# Patient Record
Sex: Female | Born: 1955 | Race: Black or African American | Hispanic: No | Marital: Single | Smoking: Never smoker
Health system: Southern US, Community
[De-identification: ages and names within clinical notes are randomized; demographics above are authoritative.]

## PROBLEM LIST (undated history)

## (undated) DIAGNOSIS — M1990 Unspecified osteoarthritis, unspecified site: Secondary | ICD-10-CM

## (undated) DIAGNOSIS — I10 Essential (primary) hypertension: Secondary | ICD-10-CM

## (undated) HISTORY — PX: ABDOMINAL HYSTERECTOMY: SHX81

## (undated) HISTORY — PX: HYSTEROTOMY: SHX1776

---

## 1998-07-07 ENCOUNTER — Ambulatory Visit (HOSPITAL_COMMUNITY): Admission: RE | Admit: 1998-07-07 | Discharge: 1998-07-07 | Payer: Self-pay | Admitting: Family Medicine

## 1998-07-07 ENCOUNTER — Encounter: Payer: Self-pay | Admitting: Family Medicine

## 1998-07-26 ENCOUNTER — Ambulatory Visit (HOSPITAL_COMMUNITY): Admission: RE | Admit: 1998-07-26 | Discharge: 1998-07-26 | Payer: Self-pay | Admitting: Family Medicine

## 1998-08-09 ENCOUNTER — Encounter: Payer: Self-pay | Admitting: Family Medicine

## 1998-08-09 ENCOUNTER — Ambulatory Visit (HOSPITAL_COMMUNITY): Admission: RE | Admit: 1998-08-09 | Discharge: 1998-08-09 | Payer: Self-pay | Admitting: Family Medicine

## 1999-09-17 ENCOUNTER — Ambulatory Visit (HOSPITAL_COMMUNITY): Admission: RE | Admit: 1999-09-17 | Discharge: 1999-09-17 | Payer: Self-pay | Admitting: *Deleted

## 1999-10-20 ENCOUNTER — Ambulatory Visit (HOSPITAL_COMMUNITY): Admission: RE | Admit: 1999-10-20 | Discharge: 1999-10-20 | Payer: Self-pay | Admitting: *Deleted

## 2000-04-12 ENCOUNTER — Ambulatory Visit (HOSPITAL_COMMUNITY): Admission: RE | Admit: 2000-04-12 | Discharge: 2000-04-12 | Payer: Self-pay | Admitting: *Deleted

## 2001-06-01 ENCOUNTER — Ambulatory Visit (HOSPITAL_COMMUNITY): Admission: RE | Admit: 2001-06-01 | Discharge: 2001-06-01 | Payer: Self-pay | Admitting: Cardiology

## 2001-06-01 ENCOUNTER — Encounter: Payer: Self-pay | Admitting: Cardiology

## 2003-06-03 ENCOUNTER — Encounter: Payer: Self-pay | Admitting: Internal Medicine

## 2003-06-03 ENCOUNTER — Ambulatory Visit (HOSPITAL_COMMUNITY): Admission: RE | Admit: 2003-06-03 | Discharge: 2003-06-03 | Payer: Self-pay | Admitting: Internal Medicine

## 2004-03-18 ENCOUNTER — Emergency Department (HOSPITAL_COMMUNITY): Admission: EM | Admit: 2004-03-18 | Discharge: 2004-03-18 | Payer: Self-pay | Admitting: Emergency Medicine

## 2004-06-20 ENCOUNTER — Encounter: Admission: RE | Admit: 2004-06-20 | Discharge: 2004-06-20 | Payer: Self-pay | Admitting: Internal Medicine

## 2004-07-13 ENCOUNTER — Ambulatory Visit (HOSPITAL_COMMUNITY): Admission: RE | Admit: 2004-07-13 | Discharge: 2004-07-13 | Payer: Self-pay | Admitting: Internal Medicine

## 2004-08-23 ENCOUNTER — Encounter: Admission: RE | Admit: 2004-08-23 | Discharge: 2004-11-18 | Payer: Self-pay | Admitting: Neurosurgery

## 2005-11-22 ENCOUNTER — Encounter: Admission: RE | Admit: 2005-11-22 | Discharge: 2005-11-22 | Payer: Self-pay | Admitting: Gastroenterology

## 2006-04-23 ENCOUNTER — Emergency Department (HOSPITAL_COMMUNITY): Admission: EM | Admit: 2006-04-23 | Discharge: 2006-04-24 | Payer: Self-pay | Admitting: Emergency Medicine

## 2006-05-14 ENCOUNTER — Emergency Department (HOSPITAL_COMMUNITY): Admission: EM | Admit: 2006-05-14 | Discharge: 2006-05-14 | Payer: Self-pay | Admitting: Family Medicine

## 2006-05-27 ENCOUNTER — Encounter: Admission: RE | Admit: 2006-05-27 | Discharge: 2006-05-27 | Payer: Self-pay | Admitting: Neurosurgery

## 2008-07-10 ENCOUNTER — Encounter: Admission: RE | Admit: 2008-07-10 | Discharge: 2008-07-10 | Payer: Self-pay | Admitting: Neurosurgery

## 2008-08-04 ENCOUNTER — Encounter: Admission: RE | Admit: 2008-08-04 | Discharge: 2008-08-04 | Payer: Self-pay | Admitting: Neurosurgery

## 2010-02-10 ENCOUNTER — Encounter: Admission: RE | Admit: 2010-02-10 | Discharge: 2010-02-10 | Payer: Self-pay | Admitting: Internal Medicine

## 2010-10-24 ENCOUNTER — Encounter: Payer: Self-pay | Admitting: Internal Medicine

## 2011-02-15 ENCOUNTER — Ambulatory Visit (HOSPITAL_COMMUNITY)
Admission: RE | Admit: 2011-02-15 | Discharge: 2011-02-15 | Disposition: A | Discharge status: Home / Self Care | Payer: BC Managed Care – PPO | Source: Ambulatory Visit | Attending: Internal Medicine | Admitting: Internal Medicine

## 2011-02-15 ENCOUNTER — Other Ambulatory Visit (HOSPITAL_COMMUNITY): Payer: Self-pay | Admitting: Internal Medicine

## 2011-02-15 DIAGNOSIS — Z1231 Encounter for screening mammogram for malignant neoplasm of breast: Secondary | ICD-10-CM

## 2011-02-18 NOTE — Cardiovascular Report (Signed)
. [facility]  Patient:    Patricia Anthony, Patricia Anthony Visit Number: [id number] MRN: [id number]          Service Type: CAT Location: [facility] 2854 [id number] Attending Physician:  Silvestre Mesi Proc. Date: 06/01/01 Admit Date:  06/01/2001   CC:         Trudee Kuster, M.D.  Cardiac Catheterization Laboratory   Cardiac Catheterization  PROCEDURES PERFORMED: 1. Left heart catheterization. 2. Coronary cineangiography. 3. Left ventricular cineangiogram. 4. Perclose of the right femoral artery.  INDICATION FOR PROCEDURES:  This 55 year old female had the recent onset of chest pain and the Cardiolite stress test which was done showed evidence for lateral ischemia.  She was then scheduled for a diagnostic cardiac catheterization.  DESCRIPTION OF PROCEDURE:  After signing an informed consent, the patient was premedicated with 50 mg of Benadryl intravenously and brought to the cardiac catheterization laboratory.  Her right groin was prepped and draped in a sterile fashion and anesthetized locally with 1% Lidocaine.  A 6-French introducer sheath was inserted percutaneously into the right femoral artery.  Six [place] #4 Judkins coronary catheters were used to make injections into the coronary arteries.  A 6-French pigtail catheter was used to measure pressures in the left ventricle and aorta and to make midstream injection into the left ventricle.  The patient tolerated the procedure well and no complications were noted.  At the end of the procedure, the catheter and sheath were removed from the right femoral artery and hemostasis was easily obtained with a Perclose closure system.  MEDICATIONS GIVEN:  Versed 1 mg IV x 3.  HEMODYNAMIC DATA:  Left ventricular pressure 142/0-13.  Aortic pressure 142/83, with a mean of 109.  Left ventricular ejection fraction was estimated at 60%.  CINE FINDINGS:  CORONARY CINEANGIOGRAPHY: Left coronary artery:  The ostium and left  main appear normal.  Left anterior descending:  Appears normal.  Circumflex coronary artery:  Appears normal.  Right coronary artery:  Appears normal.  LEFT VENTRICULAR CINEANGIOGRAM:  The left ventricular chamber size and contractility appear normal.  The mitral valve shows mild prolapse of the posterior leaflet.  There was no evidence for significant mitral insufficiency and there was no mitral valvular or annular calcification.  The aortic valve appeared normal with three cusps and no evidence for thickening or calcification.  INTERPRETATION: 1. Normal coronary arteries. 2. Normal left ventricular function. 3. Mild mitral valve prolapse without evidence for insufficiency. 4. Normal aortic valve. 5. Successful Perclose of the right femoral artery.  DISPOSITION:  We will monitor in the short-stay unit prior to discharge later today.  We will notify Dr. Jearl Klinefelter of the results and have followup with him in the office. Attending Physician:  Silvestre Mesi DD:  06/01/01 TD:  06/01/01 Job: [phone] [id number]

## 2012-01-01 ENCOUNTER — Encounter (HOSPITAL_COMMUNITY): Payer: Self-pay | Admitting: *Deleted

## 2012-01-01 ENCOUNTER — Emergency Department (HOSPITAL_COMMUNITY)
Admission: EM | Admit: 2012-01-01 | Discharge: 2012-01-01 | Disposition: A | Discharge status: Home / Self Care | Payer: BC Managed Care – PPO | Source: Home / Self Care

## 2012-01-01 DIAGNOSIS — M069 Rheumatoid arthritis, unspecified: Secondary | ICD-10-CM

## 2012-01-01 DIAGNOSIS — R21 Rash and other nonspecific skin eruption: Secondary | ICD-10-CM

## 2012-01-01 HISTORY — DX: Essential (primary) hypertension: I10

## 2012-01-01 HISTORY — DX: Unspecified osteoarthritis, unspecified site: M19.90

## 2012-01-01 MED ORDER — HYDROCODONE-ACETAMINOPHEN 5-500 MG PO TABS: 1.0000 | ORAL_TABLET | Freq: Four times a day (QID) | ORAL | Status: AC | PRN

## 2012-01-01 MED ORDER — PREDNISONE 20 MG PO TABS: 20.0000 mg | ORAL_TABLET | Freq: Every day | ORAL | Status: AC

## 2012-01-01 NOTE — Discharge Instructions (Signed)
Thank you for coming in today. Stop your old prednisone.  Start the new prednisone 1 pill daily until you see your doctor.  Use the hydrocortisone as needed.  I think you are having a flair of your rheumatoid arthritis. Call or go to the emergency room if you get worse, have trouble breathing, have chest pains, or palpitations.     Rheumatoid Arthritis Rheumatoid arthritis is a long-term (chronic) inflammatory disease that causes pain, swelling, and stiffness of the joints. It can affect the entire body. The effects of rheumatoid arthritis vary widely among those with the condition. CAUSES  The cause of rheumatoid arthritis is not known. It tends to run in families and is more common in women. Certain cells of the body's natural defense system (immune system) do not work properly and begin to attack healthy joints. It primarily involves the connective tissue that lines the joints (synovial membrane). This can cause damage to the joint. SYMPTOMS   Pain, stiffness, swelling, and decreased motion of many joints, especially in the hands and feet.   Stiffness that is worse in the morning. It may last 1 to 2 hours or longer.   Fatigue.   Loss of appetite.   Low-grade fever.   Dry eyes and mouth.   Firm lumps (rheumatoid nodules) that grow beneath the skin in areas such as the elbows and hands.  DIAGNOSIS  Diagnosis is based on the symptoms described, exam, and blood tests. Sometimes, X-rays are helpful. TREATMENT  The goals of treatment are to relieve pain, reduce inflammation, and slow down or stop joint damage and disability. Methods vary and may include:  Maintaining a balance of rest, exercise, and proper nutrition.   Medicines:   Pain relievers (analgesics).   Corticosteroids and nonsteroidal anti-inflammatory drugs (NSAIDs) to reduce inflammation.   Disease-modifying antirheumatic drugs (DMARDs) to try to slow the course of the disease.   Biologic response modifiers to  reduce inflammation and damage.   Surgery for patients with severe joint damage. Joint replacement or fusing of joints may be needed.   Routine monitoring and ongoing care, such as office visits, blood and urine tests, and x-rays.  HOME CARE INSTRUCTIONS   Remain physically active and reduce activity when the disease gets worse.   Eat a well-balanced diet.   Use heat on affected joints when you wake up and before activities.   Use ice on affected joints following activities or exercising.   Take all medicines and supplements as directed by your caregiver.   Use splints as your caregiver prescribes. Splints help maintain joint position and function.   Do not sleep with pillows under your knees. This may lead to spasms.   Participate in a self-management program to keep current with the latest treatment and coping skills.  SEEK IMMEDIATE MEDICAL CARE IF:  You have fainting episodes.   You have periods of extreme weakness.   A hot, painful joint develops rapidly and is more severe than usual joint aches.   You develop chills.   You have a fever.  FOR MORE INFORMATION  American College of Rheumatology: www.rheumatology.org Arthritis Foundation: www.arthritis.org Document Released: 09/16/2000 Document Revised: 09/08/2011 Document Reviewed: 12/31/2009 [facility] Patient Information 2012 [place], [place].

## 2012-01-01 NOTE — ED Notes (Signed)
Pt with onset of pain left leg toes to above knee Thursday rash started on legs now spreading all over - eye lids swollen - rash painful to touch

## 2012-01-01 NOTE — ED Provider Notes (Signed)
Medical screening examination/treatment/procedure(s) were performed by non-physician practitioner and as supervising physician I was immediately available for consultation/collaboration.  Marianna Cid   Zoya Sprecher, MD 01/01/12 1958 

## 2012-01-01 NOTE — ED Provider Notes (Signed)
Patricia Anthony is a 56 y.o. female who presents to Urgent Care today for left leg pain associated with full body out break of painful erythematous papules starting on Thursday.  Patient has rheumatoid arthritis which is treated with prednisone and methotrexate. One or 2 months ago her methotrexate was reduced from 9 tablets a week to 1 tablet a week.  Otherwise she feels well without any fevers chills trouble breathing or itching.   She has pain in her left leg including her knee but has not had pain elsewhere in her body.     PMH reviewed. Significant for rheumatoid arthritis ROS as above otherwise neg.  no chest pains, palpitations, fevers, chills, abdominal pain nausea or vomiting. Medications reviewed. No current facility-administered medications for this encounter.   Current Outpatient Prescriptions  Medication Sig Dispense Refill  . FOLIC ACID PO Take by mouth.      Marland Kitchen HYDROCHLOROTHIAZIDE PO Take by mouth.      . Methotrexate Sodium (METHOTREXATE PO) Take by mouth. Once a week      . NABUMETONE PO Take by mouth.      . potassium chloride (K-DUR) 10 MEQ tablet Take by mouth 2 (two) times daily. Unknown dosage      . HYDROcodone-acetaminophen (VICODIN) 5-500 MG per tablet Take 1 tablet by mouth every 6 (six) hours as needed for pain.  30 tablet  0  . predniSONE (DELTASONE) 20 MG tablet Take 1 tablet (20 mg total) by mouth daily.  20 tablet  0    Exam:  BP 155/86  Pulse 86  Temp(Src) 98.9 F (37.2 C) (Oral)  Resp 18  SpO2 96% Gen: Well NAD HEENT: EOMI,  MMM Lungs: CTABL Nl WOB Heart: RRR no MRG Abd: NABS, NT, ND Skin: Small palpable erythematous papules that blanch and are mildly tender on both lower extremities and scattered across the trunk and upper extremities.  MSK: Mild left knee effusion otherwise nonremarkable  No results found for this or any previous visit (from the past 24 hour(s)). No results found.  Assessment and Plan: 56 y.o. female with possible rheumatology  flair.  Shingles is a possibility however it is across most of her body and her pain is only on the left side.   I don't think these are purpura  as they blanch.  As her anemia modulator dose has been drastically reduced recently I think that rheumatologic flare is the most likely etiology.  Plan to treat with prednisone 20 mg daily until she follows up with her primary care doctor.  Discussed warning signs or symptoms.  Patient expresses understanding. Please see patient handout.     Rodolph Bong, MD 01/01/12 [phone]

## 2012-03-21 ENCOUNTER — Other Ambulatory Visit (HOSPITAL_COMMUNITY): Payer: Self-pay | Admitting: Internal Medicine

## 2012-03-21 DIAGNOSIS — Z1231 Encounter for screening mammogram for malignant neoplasm of breast: Secondary | ICD-10-CM

## 2012-04-12 ENCOUNTER — Ambulatory Visit (HOSPITAL_COMMUNITY)
Admission: RE | Admit: 2012-04-12 | Discharge: 2012-04-12 | Disposition: A | Discharge status: Home / Self Care | Payer: BC Managed Care – PPO | Source: Ambulatory Visit | Attending: Internal Medicine | Admitting: Internal Medicine

## 2012-04-12 DIAGNOSIS — Z1231 Encounter for screening mammogram for malignant neoplasm of breast: Secondary | ICD-10-CM

## 2012-07-29 ENCOUNTER — Emergency Department (HOSPITAL_COMMUNITY)
Admission: EM | Admit: 2012-07-29 | Discharge: 2012-07-30 | Disposition: A | Discharge status: Home / Self Care | Payer: BC Managed Care – PPO | Attending: Emergency Medicine | Admitting: Emergency Medicine

## 2012-07-29 ENCOUNTER — Encounter (HOSPITAL_COMMUNITY): Payer: Self-pay | Admitting: Emergency Medicine

## 2012-07-29 ENCOUNTER — Emergency Department (HOSPITAL_COMMUNITY): Payer: BC Managed Care – PPO

## 2012-07-29 DIAGNOSIS — M129 Arthropathy, unspecified: Secondary | ICD-10-CM | POA: Insufficient documentation

## 2012-07-29 DIAGNOSIS — Z79899 Other long term (current) drug therapy: Secondary | ICD-10-CM | POA: Insufficient documentation

## 2012-07-29 DIAGNOSIS — T148XXA Other injury of unspecified body region, initial encounter: Secondary | ICD-10-CM | POA: Insufficient documentation

## 2012-07-29 DIAGNOSIS — I10 Essential (primary) hypertension: Secondary | ICD-10-CM | POA: Insufficient documentation

## 2012-07-29 DIAGNOSIS — Y939 Activity, unspecified: Secondary | ICD-10-CM | POA: Insufficient documentation

## 2012-07-29 MED ORDER — IBUPROFEN 600 MG PO TABS: 600.0000 mg | ORAL_TABLET | Freq: Four times a day (QID) | ORAL | Status: DC | PRN

## 2012-07-29 MED ORDER — SODIUM CHLORIDE 0.9 % IV BOLUS (SEPSIS)
1000.0000 mL | Freq: Once | INTRAVENOUS | Status: AC
  Administered 2012-07-29: 1000 mL via INTRAVENOUS

## 2012-07-29 NOTE — ED Notes (Signed)
Patient in route to X-ray.  
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
## 2013-02-24 ENCOUNTER — Emergency Department (HOSPITAL_COMMUNITY): Payer: BC Managed Care – PPO

## 2013-02-24 ENCOUNTER — Encounter (HOSPITAL_COMMUNITY): Payer: Self-pay | Admitting: *Deleted

## 2013-02-24 ENCOUNTER — Emergency Department (HOSPITAL_COMMUNITY)
Admission: EM | Admit: 2013-02-24 | Discharge: 2013-02-24 | Disposition: A | Discharge status: Home / Self Care | Payer: BC Managed Care – PPO | Attending: Emergency Medicine | Admitting: Emergency Medicine

## 2013-02-24 DIAGNOSIS — Z8739 Personal history of other diseases of the musculoskeletal system and connective tissue: Secondary | ICD-10-CM | POA: Insufficient documentation

## 2013-02-24 DIAGNOSIS — M1610 Unilateral primary osteoarthritis, unspecified hip: Secondary | ICD-10-CM | POA: Insufficient documentation

## 2013-02-24 DIAGNOSIS — M5432 Sciatica, left side: Secondary | ICD-10-CM

## 2013-02-24 DIAGNOSIS — Z79899 Other long term (current) drug therapy: Secondary | ICD-10-CM | POA: Insufficient documentation

## 2013-02-24 DIAGNOSIS — M25559 Pain in unspecified hip: Secondary | ICD-10-CM | POA: Insufficient documentation

## 2013-02-24 DIAGNOSIS — M5136 Other intervertebral disc degeneration, lumbar region: Secondary | ICD-10-CM

## 2013-02-24 DIAGNOSIS — M519 Unspecified thoracic, thoracolumbar and lumbosacral intervertebral disc disorder: Secondary | ICD-10-CM | POA: Insufficient documentation

## 2013-02-24 DIAGNOSIS — M169 Osteoarthritis of hip, unspecified: Secondary | ICD-10-CM

## 2013-02-24 DIAGNOSIS — M5430 Sciatica, unspecified side: Secondary | ICD-10-CM | POA: Insufficient documentation

## 2013-02-24 DIAGNOSIS — I10 Essential (primary) hypertension: Secondary | ICD-10-CM | POA: Insufficient documentation

## 2013-02-24 LAB — URINALYSIS, ROUTINE W REFLEX MICROSCOPIC
Glucose, UA: NEGATIVE mg/dL
Protein, ur: NEGATIVE mg/dL
Specific Gravity, Urine: 1.023 (ref 1.005–1.030)
pH: 6.5 (ref 5.0–8.0)

## 2013-02-24 LAB — URINE MICROSCOPIC-ADD ON

## 2013-02-24 LAB — CBC WITH DIFFERENTIAL/PLATELET
HCT: 38.8 % (ref 36.0–46.0)
Hemoglobin: 12.9 g/dL (ref 12.0–15.0)
Lymphs Abs: 2.2 10*3/uL (ref 0.7–4.0)
MCH: 30.3 pg (ref 26.0–34.0)
Monocytes Relative: 4 % (ref 3–12)
Neutro Abs: 7.3 10*3/uL (ref 1.7–7.7)
Neutrophils Relative %: 73 % (ref 43–77)
RBC: 4.26 MIL/uL (ref 3.87–5.11)

## 2013-02-24 LAB — BASIC METABOLIC PANEL
BUN: 14 mg/dL (ref 6–23)
CO2: 27 mEq/L (ref 19–32)
Chloride: 102 mEq/L (ref 96–112)
Glucose, Bld: 102 mg/dL — ABNORMAL HIGH (ref 70–99)
Potassium: 3.8 mEq/L (ref 3.5–5.1)

## 2013-02-24 MED ORDER — ASPIRIN 325 MG PO TABS
325.0000 mg | ORAL_TABLET | Freq: Once | ORAL | Status: AC
  Administered 2013-02-24: 325 mg via ORAL
  Filled 2013-02-24: qty 1

## 2013-02-24 MED ORDER — MORPHINE SULFATE 4 MG/ML IJ SOLN
4.0000 mg | Freq: Once | INTRAMUSCULAR | Status: AC
  Administered 2013-02-24: 4 mg via INTRAVENOUS
  Filled 2013-02-24: qty 1

## 2013-02-24 MED ORDER — HYDROMORPHONE HCL PF 1 MG/ML IJ SOLN
1.0000 mg | Freq: Once | INTRAMUSCULAR | Status: AC
  Administered 2013-02-24: 1 mg via INTRAVENOUS
  Filled 2013-02-24: qty 1

## 2013-02-24 MED ORDER — ONDANSETRON HCL 4 MG/2ML IJ SOLN
4.0000 mg | Freq: Once | INTRAMUSCULAR | Status: AC
  Administered 2013-02-24: 4 mg via INTRAVENOUS
  Filled 2013-02-24: qty 2

## 2013-02-24 MED ORDER — OXYCODONE-ACETAMINOPHEN 5-325 MG PO TABS: 1.0000 | ORAL_TABLET | ORAL | Status: DC | PRN

## 2013-02-24 NOTE — ED Notes (Signed)
Pt transported to xray 

## 2013-02-24 NOTE — ED Notes (Signed)
Pt presents to ed with c/o left hip pain and chest pain. Pt was hypertensive on arrival.

## 2013-02-24 NOTE — ED Provider Notes (Signed)
Medical screening examination/treatment/procedure(s) were performed by non-physician practitioner and as supervising physician I was immediately available for consultation/collaboration.   Yessika Otte L Prathik Aman, MD 02/24/13 1426 

## 2013-02-24 NOTE — ED Provider Notes (Signed)
History     CSN: [id number]  Arrival date & time 02/24/13  1056   First MD Initiated Contact with Patient 02/24/13 1058      Chief Complaint  Patient presents with  . Chest Pain  . Hip Pain    (Consider location/radiation/quality/duration/timing/severity/associated sxs/prior treatment) HPI  Patricia Anthony is a 57 year old female with a past medical history of hypertension and rheumatoid arthritis presents emergency department with chief complaint of left hip and leg pain.  Patient also began complaining of chest pain during her triage.  Patient states she has a past medical history of sciatica type of pain pattern down her left leg.  This has been recurrent and self resolving.  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  She is bending and sitting in various positions for long periods of time.  This has caused an exacerbation of her sciatic pain on the left side.  She complains of pain in the hip that radiates down to back of her leg and down the left outer side of her lower extremity.  She complains of some numbness and tingling in the left fifth and fourth digit. Denies weakness, loss of bowel/bladder function or saddle anesthesia. Denies neck stiffness, headache, rash.  Denies fever or recent procedures to back.  The patient denies any injuries to her back or hip.. She is currently taking 5 mg of prednisone daily for her RA along with recently starting on Humira last week. Denies fevers, chills, myalgias, arthralgias. Denies DOE, SOB, chest tightness or pressure, radiation to left arm, jaw or back, or diaphoresis. Denies dysuria, flank pain, suprapubic pain, frequency, urgency, or hematuria. Denies headaches, light headedness, weakness, visual disturbances. Denies abdominal pain, nausea, vomiting, diarrhea or constipation.     Past Medical History  Diagnosis Date  . Arthritis   . Hypertension     Past Surgical History  Procedure Laterality Date  .  Hysterotomy      History reviewed. No pertinent family history.  History  Substance Use Topics  . Smoking status: Never Smoker   . Smokeless tobacco: Not on file  . Alcohol Use: No    OB History   Grav Para Term Preterm Abortions TAB SAB Ect Mult Living                  Review of Systems Ten systems reviewed and are negative for acute change, except as noted in the HPI.   Allergies  Review of patient's allergies indicates no known allergies.  Home Medications   Current Outpatient Rx  Name  Route  Sig  Dispense  Refill  . folic acid (FOLVITE) 1 MG tablet   Oral   Take 1 mg by mouth daily.         . hydrochlorothiazide (HYDRODIURIL) 25 MG tablet   Oral   Take 25 mg by mouth daily.         Marland Kitchen ibuprofen (ADVIL,MOTRIN) 600 MG tablet   Oral   Take 1 tablet (600 mg total) by mouth every 6 (six) hours as needed for pain.   30 tablet   0   . nabumetone (RELAFEN) 750 MG tablet   Oral   Take 750 mg by mouth 2 (two) times daily as needed. For pain         . naproxen sodium (ANAPROX) 220 MG tablet   Oral   Take 440 mg by mouth daily as needed. For pain         .  potassium chloride (K-DUR) 10 MEQ tablet   Oral   Take by mouth 2 (two) times daily. Unknown dosage         . predniSONE (DELTASONE) 5 MG tablet   Oral   Take 5 mg by mouth 2 (two) times daily.           BP 160/93  Pulse 64  Temp(Src) 98.5 F (36.9 C)  Resp 18  SpO2 99%  Physical Exam Physical Exam  Nursing note and vitals reviewed. Constitutional: She is oriented to person, place, and time. She appears well-developed and well-nourished. No distress.  HENT:  Head: Normocephalic and atraumatic.  Eyes: Conjunctivae normal and EOM are normal. Pupils are equal, round, and reactive to light. No scleral icterus.  Neck: Normal range of motion.  Cardiovascular: Normal rate, regular rhythm and normal heart sounds.  Exam reveals no gallop and no friction rub.   No murmur heard. Pulmonary/Chest:  Effort normal and breath sounds normal. No respiratory distress.  Abdominal: Soft. Bowel sounds are normal. She exhibits no distension and no mass. There is no tenderness. There is no guarding.  Musculoskeletal: Patient with positive straight leg raise on the left side at approximately 100.  She has pain with internal and external rotation of the hip.  Deep tendon reflexes of the patella bilaterally normal.  She has no weakness no abnormal tone patient has no midline spinal tenderness no tenderness over the sciatic notches or lumbar paraspinals. . Neurological: She is alert and oriented to person, place, and time.  Skin: Skin is warm and dry. She is not diaphoretic.    ED Course  Procedures (including critical care time)  Labs Reviewed  CBC WITH DIFFERENTIAL  BASIC METABOLIC PANEL   Dg Chest 2 View  02/24/2013   *RADIOLOGY REPORT*  Clinical Data: Shortness of breath  CHEST - 2 VIEW  Comparison: 07/29/2012  Findings: Lungs are clear. No pleural effusion or pneumothorax.  Mild cardiomegaly.  Visualized osseous structures are within normal limits.  IMPRESSION: No evidence of acute cardiopulmonary disease.   Original Report Authenticated By: Charline Bills, M.D.   Dg Lumbar Spine Complete  02/24/2013   *RADIOLOGY REPORT*  Clinical Data: Low back pain radiating to the left hip and down the left leg  LUMBAR SPINE - COMPLETE 4+ VIEW  Comparison: 04/24/2006; lumbar spine MRI - 05/27/2006  Findings:  There are five non-rib bearing lumbar type vertebral bodies. Normal alignment of the lumbar spine.  No anterolisthesis or retrolisthesis.  No definite pars defects.  Lumbar vertebral body heights are preserved.  There is mild DDD of T12 - L1 and L5 - S1 with disc space height loss, end plate irregularity and sclerosis, likely progressed since prior examination.  Limited visualization of the bilateral SI joints is normal. Regional bowel gas pattern is normal. Minimal amount of atherosclerotic plaque within  the abdominal aorta.  There is an approximately 4 mm opacity overlying the soft tissues to the right lateral aspect of the L3 vertebral body which is indeterminate in location but may represent a ureteral stone.  IMPRESSION: 1.  Suspected progression of mild multilevel DDD as above. 2. Punctate ( approximately 4 mm) opacity overlying the soft tissues adjacent to the right side of the L3 vertebral body is in an indeterminate location though could represent a right-sided ureteral stone. Clinical correlation is advised.   Original Report Authenticated By: Tacey Ruiz, MD   Dg Hip Complete Left  02/24/2013   *RADIOLOGY REPORT*  Clinical Data: Left hip  pain, radiating to the left leg  LEFT HIP - COMPLETE 2+ VIEW  Comparison: 07/29/2012  Findings:  No fracture or dislocation.  Very mild degenerative change of the left hip is suspected with joint space loss and subchondral sclerosis.  No evidence of avascular necrosis.  Limited visualization of the adjacent pelvis is normal.  A dermal calcifications overlies the soft tissues about the left hip. Similar appearing dermal calcifications overlie the right hip.  IMPRESSION: Suspected mild degenerative change of the left hip.   Original Report Authenticated By: Tacey Ruiz, MD     1. Sciatica, left   2. DDD (degenerative disc disease), lumbar   3. OA (osteoarthritis) of hip      Date: 02/24/2013  Rate: 64  Rhythm: normal sinus rhythm  QRS Axis: normal  Intervals: normal  ST/T Wave abnormalities: normal  Conduction Disutrbances: none  Narrative Interpretation:   Old EKG Reviewed: No significant changes noted     MDM  12:02 PM Filed Vitals:   02/24/13 1148  BP: 160/93  Pulse: 64  Temp:   Resp: 18   Patient has received pain medication which significantly reduced her blood pressure.  She did take her medication this morning as normal.  I suspect the patient has sciatica considering her symptoms and positive straight leg test.  Currently obtaining  imaging with a left hip x-ray and lumbar x-ray.  Also evaluating the patient for possible hypertensive urgency due to her chest pain and elevated pressure at triage.  Her EKG is unremarkable.  And unchanged since October of 2013.    1:54 PM BP 160/93  Pulse 64  Temp(Src) 98.5 F (36.9 C)  Resp 18  SpO2 99% Patient has had some relief od her pain.  Her imaging showed degenerative disc disease in the lumbar spine as well some arthritis of the left hip.  There is a small punctate lesion suspicious for possible ureteral stone by L3 on plain film.  I do not suspect the patient has a stone as it is not correlate clinically with her picture.  She does have a small amount of leukocytes on her UA but did not appear to be an infection she has 0-2 white blood cells per high-powered field.  No blood and no crystals.  Going to change the patient's pain medication and have her follow up with orthopedics concerning her sciatica and hip arthritis.  Also suggest to her work that they need to change her from her current position as it is causing exacerbation of her symptoms. The patient appears reasonably screened and/or stabilized for discharge and I doubt any other medical condition or other [facility] requiring further screening, evaluation, or treatment in the ED at this time prior to discharge.       Arthor Captain, PA-C 02/24/13 1409

## 2013-06-17 ENCOUNTER — Other Ambulatory Visit (HOSPITAL_COMMUNITY): Payer: Self-pay | Admitting: Internal Medicine

## 2013-06-17 DIAGNOSIS — Z1231 Encounter for screening mammogram for malignant neoplasm of breast: Secondary | ICD-10-CM

## 2013-06-18 ENCOUNTER — Ambulatory Visit (HOSPITAL_COMMUNITY)
Admission: RE | Admit: 2013-06-18 | Discharge: 2013-06-18 | Disposition: A | Discharge status: Home / Self Care | Payer: BC Managed Care – PPO | Source: Ambulatory Visit | Attending: Internal Medicine | Admitting: Internal Medicine

## 2013-06-18 DIAGNOSIS — Z1231 Encounter for screening mammogram for malignant neoplasm of breast: Secondary | ICD-10-CM | POA: Insufficient documentation

## 2014-06-25 ENCOUNTER — Other Ambulatory Visit (HOSPITAL_COMMUNITY): Payer: Self-pay | Admitting: Internal Medicine

## 2014-06-25 DIAGNOSIS — Z1231 Encounter for screening mammogram for malignant neoplasm of breast: Secondary | ICD-10-CM

## 2014-06-27 ENCOUNTER — Ambulatory Visit (HOSPITAL_COMMUNITY)
Admission: RE | Admit: 2014-06-27 | Discharge: 2014-06-27 | Disposition: A | Discharge status: Home / Self Care | Payer: BC Managed Care – PPO | Source: Ambulatory Visit | Attending: Internal Medicine | Admitting: Internal Medicine

## 2014-06-27 DIAGNOSIS — Z1231 Encounter for screening mammogram for malignant neoplasm of breast: Secondary | ICD-10-CM

## 2017-07-13 DIAGNOSIS — Z0000 Encounter for general adult medical examination without abnormal findings: Secondary | ICD-10-CM | POA: Diagnosis not present

## 2017-07-27 DIAGNOSIS — M810 Age-related osteoporosis without current pathological fracture: Secondary | ICD-10-CM | POA: Diagnosis not present

## 2017-07-27 DIAGNOSIS — Z0000 Encounter for general adult medical examination without abnormal findings: Secondary | ICD-10-CM | POA: Diagnosis not present

## 2017-07-27 DIAGNOSIS — M069 Rheumatoid arthritis, unspecified: Secondary | ICD-10-CM | POA: Diagnosis not present

## 2017-07-27 DIAGNOSIS — I10 Essential (primary) hypertension: Secondary | ICD-10-CM | POA: Diagnosis not present

## 2018-03-09 ENCOUNTER — Encounter (HOSPITAL_COMMUNITY): Payer: Self-pay

## 2018-03-09 ENCOUNTER — Emergency Department (HOSPITAL_COMMUNITY): Payer: BLUE CROSS/BLUE SHIELD

## 2018-03-09 ENCOUNTER — Emergency Department (HOSPITAL_COMMUNITY)
Admission: EM | Admit: 2018-03-09 | Discharge: 2018-03-09 | Disposition: A | Discharge status: Home / Self Care | Payer: BLUE CROSS/BLUE SHIELD | Attending: Emergency Medicine | Admitting: Emergency Medicine

## 2018-03-09 ENCOUNTER — Other Ambulatory Visit: Payer: Self-pay

## 2018-03-09 DIAGNOSIS — Z79899 Other long term (current) drug therapy: Secondary | ICD-10-CM | POA: Insufficient documentation

## 2018-03-09 DIAGNOSIS — S161XXA Strain of muscle, fascia and tendon at neck level, initial encounter: Secondary | ICD-10-CM | POA: Diagnosis not present

## 2018-03-09 DIAGNOSIS — Z041 Encounter for examination and observation following transport accident: Secondary | ICD-10-CM | POA: Diagnosis not present

## 2018-03-09 DIAGNOSIS — Y929 Unspecified place or not applicable: Secondary | ICD-10-CM | POA: Diagnosis not present

## 2018-03-09 DIAGNOSIS — Y999 Unspecified external cause status: Secondary | ICD-10-CM | POA: Diagnosis not present

## 2018-03-09 DIAGNOSIS — M25519 Pain in unspecified shoulder: Secondary | ICD-10-CM | POA: Diagnosis not present

## 2018-03-09 DIAGNOSIS — R42 Dizziness and giddiness: Secondary | ICD-10-CM | POA: Insufficient documentation

## 2018-03-09 DIAGNOSIS — M25512 Pain in left shoulder: Secondary | ICD-10-CM | POA: Insufficient documentation

## 2018-03-09 DIAGNOSIS — R52 Pain, unspecified: Secondary | ICD-10-CM | POA: Diagnosis not present

## 2018-03-09 DIAGNOSIS — S39012A Strain of muscle, fascia and tendon of lower back, initial encounter: Secondary | ICD-10-CM | POA: Diagnosis not present

## 2018-03-09 DIAGNOSIS — T148XXA Other injury of unspecified body region, initial encounter: Secondary | ICD-10-CM | POA: Diagnosis not present

## 2018-03-09 DIAGNOSIS — Y939 Activity, unspecified: Secondary | ICD-10-CM | POA: Diagnosis not present

## 2018-03-09 DIAGNOSIS — M542 Cervicalgia: Secondary | ICD-10-CM

## 2018-03-09 MED ORDER — METHOCARBAMOL 500 MG PO TABS
500.0000 mg | ORAL_TABLET | Freq: Once | ORAL | Status: AC
  Administered 2018-03-09: 500 mg via ORAL
  Filled 2018-03-09: qty 1

## 2018-03-09 MED ORDER — IBUPROFEN 800 MG PO TABS
800.0000 mg | ORAL_TABLET | Freq: Once | ORAL | Status: AC
  Administered 2018-03-09: 800 mg via ORAL
  Filled 2018-03-09: qty 1

## 2018-03-09 MED ORDER — METHOCARBAMOL 500 MG PO TABS: 500.0000 mg | ORAL_TABLET | Freq: Two times a day (BID) | ORAL | 0 refills | Status: DC

## 2018-03-09 MED ORDER — NAPROXEN 500 MG PO TABS: 500.0000 mg | ORAL_TABLET | Freq: Two times a day (BID) | ORAL | 0 refills | Status: DC
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
## 2018-03-09 NOTE — Discharge Instructions (Addendum)

## 2018-03-09 NOTE — ED Notes (Signed)
Bed: WTR7 Expected date:  Expected time:  Means of arrival:  Comments: 
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
## 2018-03-13 ENCOUNTER — Encounter (HOSPITAL_COMMUNITY): Payer: Self-pay | Admitting: *Deleted

## 2018-03-13 ENCOUNTER — Emergency Department (HOSPITAL_COMMUNITY)
Admission: EM | Admit: 2018-03-13 | Discharge: 2018-03-13 | Disposition: A | Discharge status: Home / Self Care | Payer: BLUE CROSS/BLUE SHIELD | Attending: Emergency Medicine | Admitting: Emergency Medicine

## 2018-03-13 ENCOUNTER — Emergency Department (HOSPITAL_COMMUNITY): Payer: BLUE CROSS/BLUE SHIELD

## 2018-03-13 DIAGNOSIS — S8254XA Nondisplaced fracture of medial malleolus of right tibia, initial encounter for closed fracture: Secondary | ICD-10-CM

## 2018-03-13 DIAGNOSIS — M25571 Pain in right ankle and joints of right foot: Secondary | ICD-10-CM

## 2018-03-13 DIAGNOSIS — Y929 Unspecified place or not applicable: Secondary | ICD-10-CM | POA: Insufficient documentation

## 2018-03-13 DIAGNOSIS — Z79899 Other long term (current) drug therapy: Secondary | ICD-10-CM | POA: Diagnosis not present

## 2018-03-13 DIAGNOSIS — I10 Essential (primary) hypertension: Secondary | ICD-10-CM | POA: Insufficient documentation

## 2018-03-13 DIAGNOSIS — Y999 Unspecified external cause status: Secondary | ICD-10-CM | POA: Diagnosis not present

## 2018-03-13 DIAGNOSIS — S99911A Unspecified injury of right ankle, initial encounter: Secondary | ICD-10-CM | POA: Diagnosis not present

## 2018-03-13 DIAGNOSIS — Y939 Activity, unspecified: Secondary | ICD-10-CM | POA: Diagnosis not present

## 2018-03-13 MED ORDER — NAPROXEN 500 MG PO TABS: 500.0000 mg | ORAL_TABLET | Freq: Two times a day (BID) | ORAL | 0 refills | Status: DC | PRN

## 2018-03-13 MED ORDER — HYDROCODONE-ACETAMINOPHEN 5-325 MG PO TABS: 1.0000 | ORAL_TABLET | Freq: Four times a day (QID) | ORAL | 0 refills | Status: DC | PRN

## 2018-03-13 NOTE — Discharge Instructions (Addendum)
Wear ankle splint at all times until you see the orthopedist. Use crutches for all weight bearing activities. Ice and elevate ankle throughout the day, using ice pack for no more than 20 minutes every hour.  Alternate between naprosyn and norco as directed for pain relief. Use your home robaxin from the prior ER visit to help with muscle spasms. Do not drive or operate machinery with norco or robaxin use. Use heat to areas of soreness EXCEPT for the ankle. Call orthopedic follow up today or tomorrow to schedule followup appointment in 5-7 days for recheck of symptoms and ongoing management of your ankle injury. Return to the ER for changes or worsening symptoms.
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
## 2018-03-23 DIAGNOSIS — S93401A Sprain of unspecified ligament of right ankle, initial encounter: Secondary | ICD-10-CM | POA: Diagnosis not present

## 2018-03-23 DIAGNOSIS — M25571 Pain in right ankle and joints of right foot: Secondary | ICD-10-CM | POA: Diagnosis not present
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
## 2018-08-02 DIAGNOSIS — I10 Essential (primary) hypertension: Secondary | ICD-10-CM | POA: Diagnosis not present

## 2018-08-02 DIAGNOSIS — Z0000 Encounter for general adult medical examination without abnormal findings: Secondary | ICD-10-CM | POA: Diagnosis not present

## 2018-08-09 DIAGNOSIS — M810 Age-related osteoporosis without current pathological fracture: Secondary | ICD-10-CM | POA: Diagnosis not present

## 2018-08-09 DIAGNOSIS — I10 Essential (primary) hypertension: Secondary | ICD-10-CM | POA: Diagnosis not present

## 2018-08-09 DIAGNOSIS — Z0000 Encounter for general adult medical examination without abnormal findings: Secondary | ICD-10-CM | POA: Diagnosis not present

## 2018-08-09 DIAGNOSIS — M069 Rheumatoid arthritis, unspecified: Secondary | ICD-10-CM | POA: Diagnosis not present

## 2019-01-28 ENCOUNTER — Emergency Department (HOSPITAL_COMMUNITY)
Admission: EM | Admit: 2019-01-28 | Discharge: 2019-01-29 | Disposition: A | Discharge status: Home / Self Care | Payer: BLUE CROSS/BLUE SHIELD | Attending: Emergency Medicine | Admitting: Emergency Medicine

## 2019-01-28 ENCOUNTER — Emergency Department (HOSPITAL_COMMUNITY): Payer: BLUE CROSS/BLUE SHIELD

## 2019-01-28 ENCOUNTER — Other Ambulatory Visit: Payer: Self-pay

## 2019-01-28 DIAGNOSIS — R05 Cough: Secondary | ICD-10-CM | POA: Insufficient documentation

## 2019-01-28 DIAGNOSIS — Z79899 Other long term (current) drug therapy: Secondary | ICD-10-CM | POA: Diagnosis not present

## 2019-01-28 DIAGNOSIS — I10 Essential (primary) hypertension: Secondary | ICD-10-CM | POA: Insufficient documentation

## 2019-01-28 DIAGNOSIS — R079 Chest pain, unspecified: Secondary | ICD-10-CM | POA: Diagnosis present

## 2019-01-28 DIAGNOSIS — R002 Palpitations: Secondary | ICD-10-CM | POA: Diagnosis not present

## 2019-01-28 DIAGNOSIS — R072 Precordial pain: Secondary | ICD-10-CM | POA: Diagnosis not present

## 2019-01-28 DIAGNOSIS — R059 Cough, unspecified: Secondary | ICD-10-CM

## 2019-01-28 LAB — CBG MONITORING, ED: Glucose-Capillary: 96 mg/dL (ref 70–99)

## 2019-01-28 NOTE — ED Triage Notes (Signed)
Pt has been c/o of mid-steral chest pain and heaviness that started last week and worsened today. Pt felt as if her heart was racing and thought she might pass out. Pt does report a dry cough that started sat.

## 2019-01-28 NOTE — ED Notes (Signed)
Pt complaining of being extremely thirsty. Provided her with ice chips until provider can advise further.

## 2019-01-28 NOTE — ED Notes (Signed)
Pt ambulated to RR without assistance.  She will collect urine in case it is needed.

## 2019-01-29 LAB — CBC WITH DIFFERENTIAL/PLATELET
Abs Immature Granulocytes: 0.03 10*3/uL (ref 0.00–0.07)
Basophils Absolute: 0.1 10*3/uL (ref 0.0–0.1)
Basophils Relative: 1 %
Eosinophils Absolute: 0.4 10*3/uL (ref 0.0–0.5)
Eosinophils Relative: 5 %
HCT: 38.4 % (ref 36.0–46.0)
Hemoglobin: 12.3 g/dL (ref 12.0–15.0)
Immature Granulocytes: 0 %
Lymphocytes Relative: 17 %
Lymphs Abs: 1.5 10*3/uL (ref 0.7–4.0)
MCH: 29.9 pg (ref 26.0–34.0)
MCHC: 32 g/dL (ref 30.0–36.0)
MCV: 93.4 fL (ref 80.0–100.0)
Monocytes Absolute: 0.7 10*3/uL (ref 0.1–1.0)
Monocytes Relative: 8 %
Neutro Abs: 6.2 10*3/uL (ref 1.7–7.7)
Neutrophils Relative %: 69 %
Platelets: 243 10*3/uL (ref 150–400)
RBC: 4.11 MIL/uL (ref 3.87–5.11)
RDW: 14.8 % (ref 11.5–15.5)
WBC: 8.9 10*3/uL (ref 4.0–10.5)
nRBC: 0 % (ref 0.0–0.2)

## 2019-01-29 LAB — BASIC METABOLIC PANEL
Anion gap: 11 (ref 5–15)
BUN: 12 mg/dL (ref 8–23)
CO2: 23 mmol/L (ref 22–32)
Calcium: 8.1 mg/dL — ABNORMAL LOW (ref 8.9–10.3)
Chloride: 102 mmol/L (ref 98–111)
Creatinine, Ser: 1.11 mg/dL — ABNORMAL HIGH (ref 0.44–1.00)
GFR calc Af Amer: >60 mL/min (ref >60)
GFR calc non Af Amer: 53 mL/min — ABNORMAL LOW (ref >60)
Glucose, Bld: 118 mg/dL — ABNORMAL HIGH (ref 70–99)
Potassium: 3.5 mmol/L (ref 3.5–5.1)
Sodium: 136 mmol/L (ref 135–145)

## 2019-01-29 LAB — TROPONIN I: Troponin I: <0.03 ng/mL (ref <0.03)

## 2019-01-29 MED ORDER — BENZONATATE 100 MG PO CAPS
100.0000 mg | ORAL_CAPSULE | Freq: Once | ORAL | Status: AC
  Administered 2019-01-29: 100 mg via ORAL
  Filled 2019-01-29: qty 1

## 2019-01-29 MED ORDER — BENZONATATE 100 MG PO CAPS: 100.0000 mg | ORAL_CAPSULE | Freq: Three times a day (TID) | ORAL | 0 refills | Status: AC

## 2019-01-29 NOTE — ED Provider Notes (Signed)
[facility]-EMERGENCY DEPT Provider Note   CSN: [id number] Arrival date & time: 01/28/19  2224    History   Chief Complaint Chief Complaint  Patient presents with  . Chest Pain  . Cough    HPI Patricia Anthony is a 63 y.o. female.  HPI: A 63 year old patient with a history of hypertension presents for evaluation of chest pain. Initial onset of pain was approximately 1-3 hours ago. The patient's chest pain is sharp and is not worse with exertion. The patient complains of nausea. The patient's chest pain is middle- or left-sided, is not well-localized, is not described as heaviness/pressure/tightness and does not radiate to the arms/jaw/neck. The patient denies diaphoresis. The patient has a family history of coronary artery disease in a first-degree relative with onset less than age 51. The patient has no history of stroke, has no history of peripheral artery disease, has not smoked in the past 90 days, denies any history of treated diabetes, has no history of hypercholesterolemia and does not have an elevated BMI (>=30).   HPI Patient reports she has had episodes of chest pain for the past week.  They come at random and feels sharp and improved with Pepto-Bismol Tonight's episode woke her up from sleep with sharp pain and then started having palpitations.  No pleuritic chest pain is reported.  She does have a dry cough.  No hemoptysis.  She does endorse mild shortness of breath earlier that is improved. She is a non-smoker.  No history of CAD/VTE. No known COVID exposures, but she is continuing to work for [employer] She does report excessive caffeine use with drinking tea throughout the day and other caffeinated beverages Past Medical History:  Diagnosis Date  . Arthritis   . Hypertension     There are no active problems to display for this patient.   Past Surgical History:  Procedure Laterality Date  . HYSTEROTOMY       OB History   No obstetric history on  file.      Home Medications    Prior to Admission medications   Medication Sig Start Date End Date Taking? Authorizing Provider  hydrochlorothiazide (HYDRODIURIL) 25 MG tablet Take 25 mg by mouth daily.    [provider]  naproxen sodium (ANAPROX) 220 MG tablet Take 660 mg by mouth daily as needed. For pain     [provider]    Family History No family history on file.  Social History Social History   Tobacco Use  . Smoking status: Never Smoker  . Smokeless tobacco: Never Used  Substance Use Topics  . Alcohol use: No  . Drug use: No     Allergies   Patient has no known allergies.   Review of Systems Review of Systems  Constitutional: Negative for fever.  Respiratory: Positive for cough and shortness of breath.   Cardiovascular: Positive for chest pain and palpitations. Negative for leg swelling.  Gastrointestinal: Negative for vomiting.  Endocrine: Positive for polydipsia.  All other systems reviewed and are negative.    Physical Exam Updated Vital Signs BP (!) 191/100   Pulse 95   Temp 98.6 F (37 C) (Oral)   Resp 16   Ht 1.575 m (5\' 2" )   Wt 68.9 kg   SpO2 100%   BMI 27.80 kg/m   Physical Exam  CONSTITUTIONAL: Well developed/well nourished HEAD: Normocephalic/atraumatic EYES: EOMI/PERRL ENMT: Mucous membranes moist NECK: supple no meningeal signs SPINE/BACK:entire spine nontender CV: S1/S2 noted, no murmurs/rubs/gallops  noted LUNGS: Lungs are clear to auscultation bilaterally, no apparent distress ABDOMEN: soft, nontender, no rebound or guarding, bowel sounds noted throughout abdomen GU:no cva tenderness NEURO: Pt is awake/alert/appropriate, moves all extremitiesx4.  No facial droop.   EXTREMITIES: pulses normal/equal, full ROM, no LE edema or tenderness SKIN: warm, color normal PSYCH: anxious  ED Treatments / Results  Labs (all labs ordered are listed, but only abnormal results are displayed) Labs Reviewed  BASIC  METABOLIC PANEL - Abnormal; Notable for the following components:      Result Value   Glucose, Bld 118 (*)    Creatinine, Ser 1.11 (*)    Calcium 8.1 (*)    GFR calc non Af Amer 53 (*)    All other components within normal limits  CBC WITH DIFFERENTIAL/PLATELET  TROPONIN I  CBG MONITORING, ED    EKG EKG Interpretation  Date/Time:  Monday January 28 2019 22:35:34 EDT Ventricular Rate:  100 PR Interval:    QRS Duration: 90 QT Interval:  353 QTC Calculation: 456 R Axis:   45 Text Interpretation:  Sinus tachycardia Interpretation limited secondary to artifact Confirmed by Zadie RhineWickline, Channell Quattrone ([id number]) on 01/28/2019 11:21:03 PM   EKG Interpretation  Date/Time:  Tuesday January 29 2019 00:11:20 EDT Ventricular Rate:  84 PR Interval:    QRS Duration: 87 QT Interval:  381 QTC Calculation: 451 R Axis:   46 Text Interpretation:  Sinus rhythm Normal ECG Confirmed by Zadie RhineWickline, Almendra Loria ([id number]) on 01/29/2019 12:13:52 AM       Radiology Dg Chest Port 1 View  Result Date: 01/28/2019 CLINICAL DATA:  Initial evaluation for acute chest pain. EXAM: PORTABLE CHEST 1 VIEW COMPARISON:  Prior radiograph from 03/09/2018. FINDINGS: Mild cardiomegaly, stable. Mediastinal silhouette within normal limits. Lungs normally inflated. Streaky mild bibasilar subsegmental atelectasis. No focal infiltrates. No edema or effusion. No pneumothorax. No acute osseous finding. IMPRESSION: 1. Mild bibasilar subsegmental atelectasis. No other active cardiopulmonary disease. 2. Stable cardiomegaly without pulmonary edema. Electronically Signed   By: Rise MuBenjamin  McClintock M.D.   On: 01/28/2019 23:49    Procedures Procedures    Medications Ordered in ED Medications  benzonatate (TESSALON) capsule 100 mg (100 mg Oral Given 01/29/19 0106)     Initial Impression / Assessment and Plan / ED Course  I have reviewed the triage vital signs and the nursing notes.  Pertinent labs & imaging results that were available during my care  of the patient were reviewed by me and considered in my medical decision making (see chart for details).     HEAR Score: 2  Presents for episodes of sharp chest pain, and tonight had episode of palpitations. She is now feeling improved.  Heart score is less than 3 1:41 AM Patient feels improved.  She is in no acute distress.  No hypoxia. She now reports her main concern is persistent dry cough.  Chest x-ray is negative per radiology. We will try her on Tessalon Perles. We had a long discussion about her results.  She would like to be discharged home without further testing Her chest pain story did appear. No tachycardia or hypoxia at this point to suggest PE.  We discussed strict return precautions. Final Clinical Impressions(s) / ED Diagnoses   Final diagnoses:  Precordial pain  Cough  Palpitations    ED Discharge Orders         Ordered    benzonatate (TESSALON) 100 MG capsule  Every 8 hours     01/29/19 0133  Zadie Rhine, MD 01/29/19 [phone]

## 2019-01-29 NOTE — ED Notes (Signed)
Called lab to check on troponin results due to it being since sent and no results listed. Lab advised "it is still running on the machine."

## 2019-01-29 NOTE — Discharge Instructions (Addendum)

## 2019-04-29 IMAGING — DX PORTABLE CHEST - 1 VIEW
1 series · 1 of 1 positions shown · non-contrast
Comparison: Prior radiograph from 03/09/2018.

CLINICAL DATA: Initial evaluation for acute chest pain.

EXAM:
PORTABLE CHEST 1 VIEW

[chest ap]
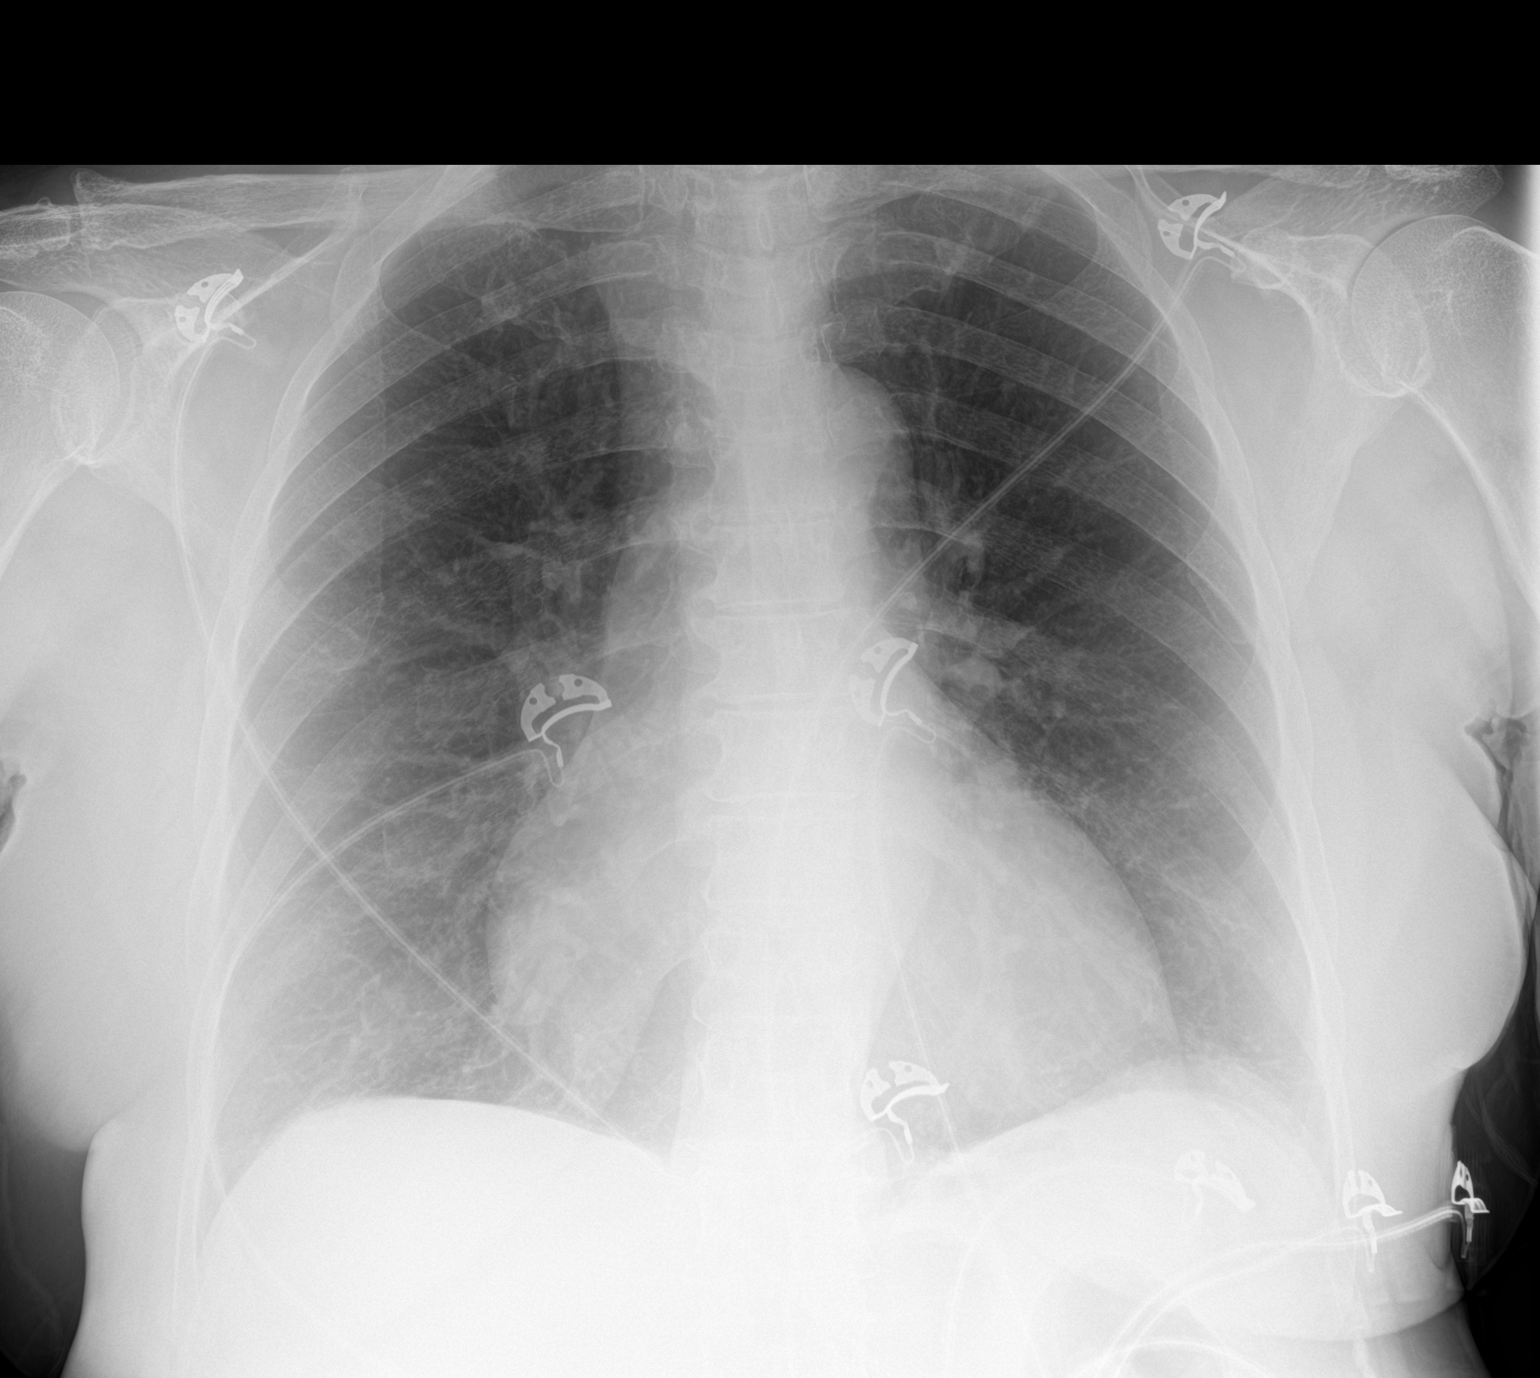

[1 of 1 positions shown; findings below may reference images not displayed]

FINDINGS: Mild cardiomegaly, stable. Mediastinal silhouette within normal
limits.

Lungs normally inflated. Streaky mild bibasilar subsegmental
atelectasis. No focal infiltrates. No edema or effusion. No
pneumothorax.

No acute osseous finding.
IMPRESSION: 1. Mild bibasilar subsegmental atelectasis. No other active
cardiopulmonary disease.
2. Stable cardiomegaly without pulmonary edema.

## 2021-03-09 ENCOUNTER — Encounter (HOSPITAL_BASED_OUTPATIENT_CLINIC_OR_DEPARTMENT_OTHER): Payer: Self-pay | Admitting: *Deleted

## 2021-03-09 ENCOUNTER — Other Ambulatory Visit: Payer: Self-pay

## 2021-03-09 DIAGNOSIS — X58XXXA Exposure to other specified factors, initial encounter: Secondary | ICD-10-CM | POA: Diagnosis not present

## 2021-03-09 DIAGNOSIS — S0591XA Unspecified injury of right eye and orbit, initial encounter: Secondary | ICD-10-CM | POA: Diagnosis present

## 2021-03-09 DIAGNOSIS — Z79899 Other long term (current) drug therapy: Secondary | ICD-10-CM | POA: Insufficient documentation

## 2021-03-09 DIAGNOSIS — S0501XA Injury of conjunctiva and corneal abrasion without foreign body, right eye, initial encounter: Secondary | ICD-10-CM | POA: Diagnosis not present

## 2021-03-09 DIAGNOSIS — I10 Essential (primary) hypertension: Secondary | ICD-10-CM | POA: Diagnosis not present

## 2021-03-09 MED ORDER — TETRACAINE HCL 0.5 % OP SOLN
2.0000 [drp] | Freq: Once | OPHTHALMIC | Status: DC
  Filled 2021-03-09: qty 4

## 2021-03-09 MED ORDER — FLUORESCEIN SODIUM 1 MG OP STRP
1.0000 | ORAL_STRIP | Freq: Once | OPHTHALMIC | Status: DC
  Filled 2021-03-09: qty 1

## 2021-03-09 NOTE — ED Triage Notes (Signed)
C/o right  eye irritation x5 hrs after cutting grass today

## 2021-03-10 ENCOUNTER — Emergency Department (HOSPITAL_BASED_OUTPATIENT_CLINIC_OR_DEPARTMENT_OTHER)
Admission: EM | Admit: 2021-03-10 | Discharge: 2021-03-10 | Disposition: A | Discharge status: Home / Self Care | Payer: BC Managed Care – PPO | Attending: Emergency Medicine | Admitting: Emergency Medicine

## 2021-03-10 DIAGNOSIS — S0501XA Injury of conjunctiva and corneal abrasion without foreign body, right eye, initial encounter: Secondary | ICD-10-CM

## 2021-03-10 MED ORDER — CYCLOPENTOLATE HCL 1 % OP SOLN
2.0000 [drp] | Freq: Once | OPHTHALMIC | Status: AC
  Administered 2021-03-10: 2 [drp] via OPHTHALMIC
  Filled 2021-03-10: qty 2

## 2021-03-10 MED ORDER — ERYTHROMYCIN 5 MG/GM OP OINT
TOPICAL_OINTMENT | Freq: Once | OPHTHALMIC | Status: AC
  Filled 2021-03-10: qty 3.5

## 2021-03-10 NOTE — ED Provider Notes (Signed)
[facility] EMERGENCY DEPARTMENT Provider Note   CSN: [id number] Arrival date & time: 03/09/21  2243     History Chief Complaint  Patient presents with  . Eye Problem    Patricia Anthony is a 65 y.o. female.  Pt presents to the Ed today with right eye pain.  Pt said she cut her grass today and something went into her eye.  Since then, she has had irritation and pain.  She purchased an eye wash at the drug store and tried to flush it.  She said it is no better.         Past Medical History:  Diagnosis Date  . Arthritis   . Hypertension     There are no problems to display for this patient.   Past Surgical History:  Procedure Laterality Date  . ABDOMINAL HYSTERECTOMY    . HYSTEROTOMY       OB History   No obstetric history on file.     No family history on file.  Social History   Tobacco Use  . Smoking status: Never Smoker  . Smokeless tobacco: Never Used  Vaping Use  . Vaping Use: Never used  Substance Use Topics  . Alcohol use: No  . Drug use: No    Home Medications Prior to Admission medications   Medication Sig Start Date End Date Taking? Authorizing Provider  benzonatate (TESSALON) 100 MG capsule Take 1 capsule (100 mg total) by mouth every 8 (eight) hours. 01/29/19   Zadie Rhine, MD  hydrochlorothiazide (HYDRODIURIL) 25 MG tablet Take 25 mg by mouth daily.    [provider]  naproxen sodium (ANAPROX) 220 MG tablet Take 660 mg by mouth daily as needed. For pain     [provider]    Allergies    Methotrexate and Leflunomide  Review of Systems   Review of Systems  HENT:       Right eye pain  All other systems reviewed and are negative.   Physical Exam Updated Vital Signs BP (!) 152/82   Pulse 70   Temp 97.9 F (36.6 C) (Oral)   Resp 16   Ht 5\' 2"  (1.575 m)   Wt 63.5 kg   SpO2 100%   BMI 25.61 kg/m   Physical Exam Vitals and nursing note reviewed.  Constitutional:      Appearance: Normal  appearance.  HENT:     Head: Normocephalic and atraumatic.     Right Ear: External ear normal.     Left Ear: External ear normal.     Nose: Nose normal.     Mouth/Throat:     Mouth: Mucous membranes are moist.     Pharynx: Oropharynx is clear.  Eyes:     Extraocular Movements: Extraocular movements intact.     Conjunctiva/sclera: Conjunctivae normal.     Pupils: Pupils are equal, round, and reactive to light.     Right eye: Corneal abrasion present.  Cardiovascular:     Rate and Rhythm: Normal rate and regular rhythm.     Pulses: Normal pulses.     Heart sounds: Normal heart sounds.  Pulmonary:     Effort: Pulmonary effort is normal.     Breath sounds: Normal breath sounds.  Abdominal:     General: Abdomen is flat. Bowel sounds are normal.     Palpations: Abdomen is soft.  Musculoskeletal:        General: Normal range of motion.     Cervical back: Normal range  of motion and neck supple.  Skin:    General: Skin is warm.     Capillary Refill: Capillary refill takes less than 2 seconds.  Neurological:     General: No focal deficit present.     Mental Status: She is alert and oriented to person, place, and time.  Psychiatric:        Mood and Affect: Mood normal.        Behavior: Behavior normal.     ED Results / Procedures / Treatments   Labs (all labs ordered are listed, but only abnormal results are displayed) Labs Reviewed - No data to display  EKG None  Radiology No results found.  Procedures Procedures   Medications Ordered in ED Medications  tetracaine (PONTOCAINE) 0.5 % ophthalmic solution 2 drop (has no administration in time range)  fluorescein ophthalmic strip 1 strip (has no administration in time range)  erythromycin ophthalmic ointment ( Right Eye Given 03/10/21 0047)  cyclopentolate (CYCLODRYL,CYCLOGYL) 1 % ophthalmic solution 2 drop (2 drops Right Eye Given 03/10/21 0047)    ED Course  I have reviewed the triage vital signs and the nursing  notes.  Pertinent labs & imaging results that were available during my care of the patient were reviewed by me and considered in my medical decision making (see chart for details).    MDM Rules/Calculators/A&P                          Pt has a corneal abrasion.  She is given 2 drops of cyclogyl prior to d/c.  She is to f/u with ophthalmology.  Return if worse. Final Clinical Impression(s) / ED Diagnoses Final diagnoses:  Abrasion of right cornea, initial encounter    Rx / DC Orders ED Discharge Orders    None       Jacalyn Lefevre, MD 03/10/21 [phone]

## 2021-09-09 DIAGNOSIS — Z0000 Encounter for general adult medical examination without abnormal findings: Secondary | ICD-10-CM | POA: Diagnosis not present

## 2021-09-16 DIAGNOSIS — Z0000 Encounter for general adult medical examination without abnormal findings: Secondary | ICD-10-CM | POA: Diagnosis not present

## 2021-09-16 DIAGNOSIS — I10 Essential (primary) hypertension: Secondary | ICD-10-CM | POA: Diagnosis not present

## 2021-09-16 DIAGNOSIS — M069 Rheumatoid arthritis, unspecified: Secondary | ICD-10-CM | POA: Diagnosis not present

## 2022-04-10 ENCOUNTER — Encounter: Payer: Self-pay | Admitting: Emergency Medicine

## 2022-04-10 ENCOUNTER — Ambulatory Visit
Admission: EM | Admit: 2022-04-10 | Discharge: 2022-04-10 | Disposition: A | Discharge status: Home / Self Care | Payer: BC Managed Care – PPO | Attending: Physician Assistant | Admitting: Physician Assistant

## 2022-04-10 ENCOUNTER — Other Ambulatory Visit: Payer: Self-pay

## 2022-04-10 DIAGNOSIS — M542 Cervicalgia: Secondary | ICD-10-CM

## 2022-04-10 DIAGNOSIS — R519 Headache, unspecified: Secondary | ICD-10-CM | POA: Diagnosis not present

## 2022-04-10 MED ORDER — METHOCARBAMOL 500 MG PO TABS: 500.0000 mg | ORAL_TABLET | Freq: Four times a day (QID) | ORAL | 0 refills | Status: AC

## 2022-04-10 MED ORDER — PREDNISONE 50 MG PO TABS: ORAL_TABLET | ORAL | Status: DC

## 2022-04-10 MED ORDER — PREDNISONE 50 MG PO TABS: ORAL_TABLET | ORAL | 0 refills | Status: AC

## 2022-04-10 NOTE — Discharge Instructions (Addendum)
Call your Physicain to be seen for evaluation

## 2022-04-10 NOTE — ED Triage Notes (Signed)
Pt sts right sided neck pain x 3 weeks into head; pt denies obvious injury

## 2022-04-11 NOTE — ED Provider Notes (Signed)
[facility]    CSN: [id number] Arrival date & time: 04/10/22  1525      History   Chief Complaint Chief Complaint  Patient presents with   Neck Pain    HPI Patricia Anthony is a 66 y.o. female.   Patient complains of pain on the right side of her head patient points to the right occipital area as the area of pain patient complains of pain down the right side of her neck and down into her right trapezius area.  Patient denies any injury she thinks the discomfort may be coming from the way that she sleeps on her pillow.  Patient denies any other neurologic symptoms she denies any visual disturbance she has not any changes to her hearing she does not have ear pain she denies seeing any rash patient denies any fever or chills patient denies any numbness or tingling in her extremities she denies any weakness  The history is provided by the patient. No language interpreter was used.  Neck Pain   Past Medical History:  Diagnosis Date   Arthritis    Hypertension     There are no problems to display for this patient.   Past Surgical History:  Procedure Laterality Date   ABDOMINAL HYSTERECTOMY     HYSTEROTOMY      OB History   No obstetric history on file.      Home Medications    Prior to Admission medications   Medication Sig Start Date End Date Taking? Authorizing Provider  methocarbamol (ROBAXIN) 500 MG tablet Take 1 tablet (500 mg total) by mouth 4 (four) times Patricia. 04/10/22  Yes Cheron Schaumann K, PA-C  benzonatate (TESSALON) 100 MG capsule Take 1 capsule (100 mg total) by mouth every 8 (eight) hours. 01/29/19   Zadie Rhine, MD  hydrochlorothiazide (HYDRODIURIL) 25 MG tablet Take 25 mg by mouth Patricia.    [provider]  naproxen sodium (ANAPROX) 220 MG tablet Take 660 mg by mouth Patricia as needed. For pain     [provider]  predniSONE (DELTASONE) 50 MG tablet One tablet a day 04/10/22   Elson Areas, PA-C    Family History History  reviewed. No pertinent family history.  Social History Social History   Tobacco Use   Smoking status: Never   Smokeless tobacco: Never  Vaping Use   Vaping Use: Never used  Substance Use Topics   Alcohol use: No   Drug use: No     Allergies   Methotrexate and Leflunomide   Review of Systems Review of Systems  Musculoskeletal:  Positive for neck pain.  All other systems reviewed and are negative.    Physical Exam Triage Vital Signs ED Triage Vitals [04/10/22 1545]  Enc Vitals Group     BP (!) 161/85     Pulse Rate 77     Resp 18     Temp 98.2 F (36.8 C)     Temp Source Oral     SpO2 95 %     Weight      Height      Head Circumference      Peak Flow      Pain Score 7     Pain Loc      Pain Edu?      Excl. in GC?    No data found.  Updated Vital Signs BP (!) 161/85 (BP Location: Left Arm)   Pulse 77   Temp 98.2 F (36.8 C) (Oral)  Resp 18   SpO2 95%   Visual Acuity Right Eye Distance:   Left Eye Distance:   Bilateral Distance:    Right Eye Near:   Left Eye Near:    Bilateral Near:     Physical Exam Vitals and nursing note reviewed.  Constitutional:      Appearance: She is well-developed.  HENT:     Head: Normocephalic.     Comments: Tender right occipital scalp right lateral neck into right trapezius, patient has some pain with range of motion decrease pain with applying pressure to her head Cardiovascular:     Rate and Rhythm: Normal rate.  Pulmonary:     Effort: Pulmonary effort is normal.  Abdominal:     General: There is no distension.  Musculoskeletal:        General: Normal range of motion.     Cervical back: Normal range of motion.  Skin:    General: Skin is warm.     Comments: No rash  Neurological:     General: No focal deficit present.     Mental Status: She is alert and oriented to person, place, and time.  Psychiatric:        Mood and Affect: Mood normal.      UC Treatments / Results  Labs (all labs ordered are  listed, but only abnormal results are displayed) Labs Reviewed - No data to display  EKG   Radiology No results found.  Procedures Procedures (including critical care time)  Medications Ordered in UC Medications - No data to display  Initial Impression / Assessment and Plan / UC Course  I have reviewed the triage vital signs and the nursing notes.  Pertinent labs & imaging results that were available during my care of the patient were reviewed by me and considered in my medical decision making (see chart for details).     MDM: Patient's symptoms are most likely radicular, I counseled her on the possibility of pain coming from her neck.  I will try her on prednisone and Robaxin.  Is advised to watch closely for rash I have advised her to schedule an appointment for follow-up with her primary care physicians.  Patient is advised to go to the emergency department if she develops any neurologic symptoms Final Clinical Impressions(s) / UC Diagnoses   Final diagnoses:  Neck pain  Scalp pain     Discharge Instructions      Call your Physicain to be seen for evaluation    ED Prescriptions     Medication Sig Dispense Auth. Provider   predniSONE (DELTASONE) 50 MG tablet  (Status: Discontinued) One tablet a day 5 tablet Margert Edsall K, PA-C   methocarbamol (ROBAXIN) 500 MG tablet Take 1 tablet (500 mg total) by mouth 4 (four) times Patricia. 20 tablet Soua Lenk K, [place]   predniSONE (DELTASONE) 50 MG tablet  (Status: Discontinued) One tablet a day 5 tablet Lonisha Bobby K, PA-C   predniSONE (DELTASONE) 50 MG tablet One tablet a day 5 tablet Elson Areas, [place]      PDMP not reviewed this encounter.   Elson Areas, [place] 04/11/22 1811

## 2022-10-12 DIAGNOSIS — M069 Rheumatoid arthritis, unspecified: Secondary | ICD-10-CM | POA: Diagnosis not present

## 2022-10-12 DIAGNOSIS — Z0000 Encounter for general adult medical examination without abnormal findings: Secondary | ICD-10-CM | POA: Diagnosis not present

## 2022-10-12 DIAGNOSIS — I10 Essential (primary) hypertension: Secondary | ICD-10-CM | POA: Diagnosis not present

## 2022-10-19 DIAGNOSIS — Z0000 Encounter for general adult medical examination without abnormal findings: Secondary | ICD-10-CM | POA: Diagnosis not present

## 2022-10-19 DIAGNOSIS — M069 Rheumatoid arthritis, unspecified: Secondary | ICD-10-CM | POA: Diagnosis not present

## 2022-10-19 DIAGNOSIS — I10 Essential (primary) hypertension: Secondary | ICD-10-CM | POA: Diagnosis not present

## 2023-05-23 DIAGNOSIS — M5441 Lumbago with sciatica, right side: Secondary | ICD-10-CM | POA: Diagnosis not present

## 2023-11-08 DIAGNOSIS — M069 Rheumatoid arthritis, unspecified: Secondary | ICD-10-CM | POA: Diagnosis not present

## 2023-11-08 DIAGNOSIS — Z0000 Encounter for general adult medical examination without abnormal findings: Secondary | ICD-10-CM | POA: Diagnosis not present

## 2023-11-08 DIAGNOSIS — M4692 Unspecified inflammatory spondylopathy, cervical region: Secondary | ICD-10-CM | POA: Diagnosis not present

## 2023-11-08 DIAGNOSIS — I10 Essential (primary) hypertension: Secondary | ICD-10-CM | POA: Diagnosis not present

## 2024-01-21 ENCOUNTER — Encounter (HOSPITAL_COMMUNITY): Payer: Self-pay

## 2024-01-21 ENCOUNTER — Ambulatory Visit (HOSPITAL_COMMUNITY)
Admission: EM | Admit: 2024-01-21 | Discharge: 2024-01-21 | Disposition: A | Discharge status: Home / Self Care | Payer: Medicare (Managed Care) | Attending: Family Medicine | Admitting: Family Medicine

## 2024-01-21 DIAGNOSIS — H5789 Other specified disorders of eye and adnexa: Secondary | ICD-10-CM

## 2024-01-21 MED ORDER — FLUORESCEIN SODIUM 1 MG OP STRP
ORAL_STRIP | OPHTHALMIC | Status: AC
  Filled 2024-01-21: qty 1

## 2024-01-21 MED ORDER — TETRACAINE HCL 0.5 % OP SOLN
OPHTHALMIC | Status: AC
  Filled 2024-01-21: qty 4

## 2024-01-21 MED ORDER — POLYMYXIN B-TRIMETHOPRIM 10000-0.1 UNIT/ML-% OP SOLN: 1.0000 [drp] | Freq: Four times a day (QID) | OPHTHALMIC | 0 refills | Status: AC

## 2024-01-21 NOTE — ED Triage Notes (Signed)
 Pt states that she feels like something is I her left eye. Pt states that she has some watering, and redness of her left eye. X2 days

## 2024-01-21 NOTE — ED Provider Notes (Signed)
 [facility]    CSN: [id number] Arrival date & time: 01/21/24  1026      History   Chief Complaint Chief Complaint  Patient presents with   Foreign Body in Eye    HPI Patricia Anthony is a 68 y.o. female. Yesterday when changing her clothes, she felt something get in her L eye "maybe a piece of lint". She tried washing it out but still feels like it is there. R eye is fine. No eye discharge, eyes not crusted shut this morning. No vision change. Just still feels like something is in her eye.    Foreign Body in Eye    Past Medical History:  Diagnosis Date   Arthritis    Hypertension     There are no active problems to display for this patient.   Past Surgical History:  Procedure Laterality Date   ABDOMINAL HYSTERECTOMY     HYSTEROTOMY      OB History   No obstetric history on file.      Home Medications    Prior to Admission medications   Medication Sig Start Date End Date Taking? Authorizing Provider  hydrochlorothiazide (HYDRODIURIL) 25 MG tablet Take 25 mg by mouth daily.   Yes [provider]  trimethoprim -polymyxin b  (POLYTRIM ) ophthalmic solution Place 1 drop into the left eye in the morning, at noon, in the evening, and at bedtime. 01/21/24  Yes Blinda Burger, NP  benzonatate  (TESSALON ) 100 MG capsule Take 1 capsule (100 mg total) by mouth every 8 (eight) hours. 01/29/19   Eldon Greenland, MD  methocarbamol  (ROBAXIN ) 500 MG tablet Take 1 tablet (500 mg total) by mouth 4 (four) times daily. 04/10/22   Sofia, Leslie K, PA-C  naproxen  sodium (ANAPROX ) 220 MG tablet Take 660 mg by mouth daily as needed. For pain     [provider]  predniSONE  (DELTASONE ) 50 MG tablet One tablet a day 04/10/22   Sofia, Leslie K, PA-C    Family History History reviewed. No pertinent family history.  Social History Social History   Tobacco Use   Smoking status: Never   Smokeless tobacco: Never  Vaping Use   Vaping status: Never Used  Substance  Use Topics   Alcohol use: No   Drug use: No     Allergies   Methotrexate and Leflunomide   Review of Systems Review of Systems   Physical Exam Triage Vital Signs ED Triage Vitals  Encounter Vitals Group     BP 01/21/24 1055 (!) 178/91     Systolic BP Percentile --      Diastolic BP Percentile --      Pulse Rate 01/21/24 1055 66     Resp 01/21/24 1055 17     Temp 01/21/24 1055 97.8 F (36.6 C)     Temp Source 01/21/24 1055 Oral     SpO2 01/21/24 1055 97 %     Weight 01/21/24 1053 157 lb (71.2 kg)     Height 01/21/24 1053 5\' 2"  (1.575 m)     Head Circumference --      Peak Flow --      Pain Score 01/21/24 1053 0     Pain Loc --      Pain Education --      Exclude from Growth Chart --    No data found.  Updated Vital Signs BP (!) 178/91 (BP Location: Left Arm)   Pulse 66   Temp 97.8 F (36.6 C) (Oral)   Resp  17   Ht 5\' 2"  (1.575 m)   Wt 157 lb (71.2 kg)   SpO2 97%   BMI 28.72 kg/m   Visual Acuity Right Eye Distance:   Left Eye Distance:   Bilateral Distance:    Right Eye Near: R Near: 20/25 Left Eye Near:  L Near: 20/25 Bilateral Near:     Physical Exam Constitutional:      Appearance: Normal appearance. She is not ill-appearing.  Eyes:     General: Lids are everted, no foreign bodies appreciated.        Left eye: No foreign body, discharge or hordeolum.     Conjunctiva/sclera:     Left eye: Left conjunctiva is injected. No exudate or hemorrhage.    Pupils:     Left eye: No corneal abrasion or fluorescein  uptake.     Slit lamp exam:    Left eye: No corneal ulcer or foreign body.  Pulmonary:     Effort: Pulmonary effort is normal.  Neurological:     Mental Status: She is alert.      UC Treatments / Results  Labs (all labs ordered are listed, but only abnormal results are displayed) Labs Reviewed - No data to display  EKG   Radiology No results found.  Procedures Procedures (including critical care time)  Medications Ordered in  UC Medications - No data to display  Initial Impression / Assessment and Plan / UC Course  I have reviewed the triage vital signs and the nursing notes.  Pertinent labs & imaging results that were available during my care of the patient were reviewed by me and considered in my medical decision making (see chart for details).     May have had foreign body but none visible today. It could be small enough that I cannot see it. Her L sclera is quite injected and her L eye is tearing. No corneal abrasion. Will tx with oph antibiotics and refer to oph if not improving by tomorrow.   Final Clinical Impressions(s) / UC Diagnoses   Final diagnoses:  Irritation of left eye     Discharge Instructions      Ok to use lubricating eye drops in between doses of the antibiotic drops for comfort - just make sure you don't use lubricating drops at the same time as the antibiotic drops so you don't wash the antibiotics out of your eye.   If you are not getting better, please follow up with the eye doctor's office listed below for ophthalmology care.    ED Prescriptions     Medication Sig Dispense Auth. Provider   trimethoprim -polymyxin b  (POLYTRIM ) ophthalmic solution Place 1 drop into the left eye in the morning, at noon, in the evening, and at bedtime. 10 mL Blinda Burger, NP      PDMP not reviewed this encounter.   Blinda Burger, NP 01/21/24 1155

## 2024-01-21 NOTE — Discharge Instructions (Signed)
 Ok to use lubricating eye drops in between doses of the antibiotic drops for comfort - just make sure you don't use lubricating drops at the same time as the antibiotic drops so you don't wash the antibiotics out of your eye.   If you are not getting better, please follow up with the eye doctor's office listed below for ophthalmology care.
